# Patient Record
Sex: Female | Born: 1980 | Race: White | Hispanic: No | Marital: Married | Smoking: Former smoker
Health system: Southern US, Community
[De-identification: ages and names within clinical notes are randomized; demographics above are authoritative.]

## PROBLEM LIST (undated history)

## (undated) DIAGNOSIS — D219 Benign neoplasm of connective and other soft tissue, unspecified: Secondary | ICD-10-CM

## (undated) DIAGNOSIS — F419 Anxiety disorder, unspecified: Secondary | ICD-10-CM

## (undated) HISTORY — PX: TUBAL LIGATION: SHX77

## (undated) HISTORY — PX: WISDOM TOOTH EXTRACTION: SHX21

---

## 2018-07-24 ENCOUNTER — Other Ambulatory Visit: Payer: Self-pay | Admitting: Orthopedic Surgery

## 2018-07-30 NOTE — Pre-Procedure Instructions (Signed)
Megan Blanchard  07/30/2018      [id number] - [place], [address] AT [facility] [address] [place] [address] Phone: [phone] Fax: [phone]    Your procedure is scheduled on Wednesday August 14.  Report to [facility] Admitting at 10:00 A.M.  Call this number if you have problems the morning of surgery:  [phone]  If you have any questions prior to surgery, please call [phone] on M-F between 8:00AM and 4:30PM   Remember:  Do not eat or drink after midnight.    Take these medicines the morning of surgery with A SIP OF WATER: NONE  7 days prior to surgery STOP taking any Aspirin(unless otherwise instructed by your surgeon), Aleve, Naproxen, Ibuprofen, Motrin, Advil, Goody's, BC's, all herbal medications, fish oil, and all vitamins     Do not wear jewelry, make-up or nail polish.  Do not wear lotions, powders, or perfumes, or deodorant.  Do not shave 48 hours prior to surgery.  Men may shave face and neck.  Do not bring valuables to the hospital.  [facility] is not responsible for any belongings or valuables.  Contacts, dentures or bridgework may not be worn into surgery.  Leave your suitcase in the car.  After surgery it may be brought to your room.  For patients admitted to the hospital, discharge time will be determined by your treatment team.  Patients discharged the day of surgery will not be allowed to drive home.   Special instructions:    [facility]- Preparing For Surgery  Before surgery, you can play an important role. Because skin is not sterile, your skin needs to be as free of germs as possible. You can reduce the number of germs on your skin by washing with CHG (chlorahexidine gluconate) Soap before surgery.  CHG is an antiseptic cleaner which kills germs and bonds with the skin to continue killing germs even after washing.    Oral Hygiene is also important to reduce your risk  of infection.  Remember - BRUSH YOUR TEETH THE MORNING OF SURGERY WITH YOUR REGULAR TOOTHPASTE  Please do not use if you have an allergy to CHG or antibacterial soaps. If your skin becomes reddened/irritated stop using the CHG.  Do not shave (including legs and underarms) for at least 48 hours prior to first CHG shower. It is OK to shave your face.  Please follow these instructions carefully.   1. Shower the NIGHT BEFORE SURGERY and the MORNING OF SURGERY with CHG.   2. If you chose to wash your hair, wash your hair first as usual with your normal shampoo.  3. After you shampoo, rinse your hair and body thoroughly to remove the shampoo.  4. Use CHG as you would any other liquid soap. You can apply CHG directly to the skin and wash gently with a scrungie or a clean washcloth.   5. Apply the CHG Soap to your body ONLY FROM THE NECK DOWN.  Do not use on open wounds or open sores. Avoid contact with your eyes, ears, mouth and genitals (private parts). Wash Face and genitals (private parts)  with your normal soap.  6. Wash thoroughly, paying special attention to the area where your surgery will be performed.  7. Thoroughly rinse your body with warm water from the neck down.  8. DO NOT shower/wash with your normal soap after using and rinsing off  the CHG Soap.  9. Pat yourself dry with a CLEAN TOWEL.  10. Wear CLEAN PAJAMAS to bed the night before surgery, wear comfortable clothes the morning of surgery  11. Place CLEAN SHEETS on your bed the night of your first shower and DO NOT SLEEP WITH PETS.    Day of Surgery:  Do not apply any deodorants/lotions.  Please wear clean clothes to the hospital/surgery center.   Remember to brush your teeth WITH YOUR REGULAR TOOTHPASTE.    Please read over the following fact sheets that you were given. Coughing and Deep Breathing, MRSA Information and Surgical Site Infection Prevention

## 2018-07-31 ENCOUNTER — Encounter (HOSPITAL_COMMUNITY): Payer: Self-pay | Admitting: *Deleted

## 2018-07-31 ENCOUNTER — Encounter (HOSPITAL_COMMUNITY)
Admission: RE | Admit: 2018-07-31 | Discharge: 2018-07-31 | Disposition: A | Discharge status: Home / Self Care | Payer: No Typology Code available for payment source | Source: Ambulatory Visit | Attending: Orthopedic Surgery | Admitting: Orthopedic Surgery

## 2018-07-31 ENCOUNTER — Ambulatory Visit (HOSPITAL_COMMUNITY)
Admission: RE | Admit: 2018-07-31 | Discharge: 2018-07-31 | Disposition: A | Discharge status: Home / Self Care | Payer: No Typology Code available for payment source | Source: Ambulatory Visit | Attending: Orthopedic Surgery | Admitting: Orthopedic Surgery

## 2018-07-31 ENCOUNTER — Other Ambulatory Visit: Payer: Self-pay

## 2018-07-31 DIAGNOSIS — Z01818 Encounter for other preprocedural examination: Secondary | ICD-10-CM | POA: Diagnosis not present

## 2018-07-31 DIAGNOSIS — M533 Sacrococcygeal disorders, not elsewhere classified: Secondary | ICD-10-CM | POA: Diagnosis not present

## 2018-07-31 DIAGNOSIS — R9431 Abnormal electrocardiogram [ECG] [EKG]: Secondary | ICD-10-CM | POA: Diagnosis not present

## 2018-07-31 DIAGNOSIS — Z01810 Encounter for preprocedural cardiovascular examination: Secondary | ICD-10-CM | POA: Diagnosis present

## 2018-07-31 HISTORY — DX: Benign neoplasm of connective and other soft tissue, unspecified: D21.9

## 2018-07-31 HISTORY — DX: Anxiety disorder, unspecified: F41.9

## 2018-07-31 LAB — URINALYSIS, ROUTINE W REFLEX MICROSCOPIC
BILIRUBIN URINE: NEGATIVE
Bacteria, UA: NONE SEEN
GLUCOSE, UA: 50 mg/dL — AB
KETONES UR: NEGATIVE mg/dL
LEUKOCYTES UA: NEGATIVE
Nitrite: NEGATIVE
PH: 5 (ref 5.0–8.0)
Protein, ur: NEGATIVE mg/dL
Specific Gravity, Urine: 1.012 (ref 1.005–1.030)

## 2018-07-31 LAB — CBC WITH DIFFERENTIAL/PLATELET
ABS IMMATURE GRANULOCYTES: 0 10*3/uL (ref 0.0–0.1)
BASOS ABS: 0 10*3/uL (ref 0.0–0.1)
Basophils Relative: 0 %
Eosinophils Absolute: 0.1 10*3/uL (ref 0.0–0.7)
Eosinophils Relative: 2 %
HCT: 43.4 % (ref 36.0–46.0)
HEMOGLOBIN: 14.1 g/dL (ref 12.0–15.0)
Immature Granulocytes: 1 %
LYMPHS ABS: 1.5 10*3/uL (ref 0.7–4.0)
LYMPHS PCT: 28 %
MCH: 29.1 pg (ref 26.0–34.0)
MCHC: 32.5 g/dL (ref 30.0–36.0)
MCV: 89.5 fL (ref 78.0–100.0)
MONO ABS: 0.6 10*3/uL (ref 0.1–1.0)
Monocytes Relative: 12 %
NEUTROS ABS: 3.2 10*3/uL (ref 1.7–7.7)
Neutrophils Relative %: 57 %
Platelets: 242 10*3/uL (ref 150–400)
RBC: 4.85 MIL/uL (ref 3.87–5.11)
RDW: 12.8 % (ref 11.5–15.5)
WBC: 5.5 10*3/uL (ref 4.0–10.5)

## 2018-07-31 LAB — COMPREHENSIVE METABOLIC PANEL
ALBUMIN: 4.1 g/dL (ref 3.5–5.0)
ALT: 25 U/L (ref 0–44)
AST: 21 U/L (ref 15–41)
Alkaline Phosphatase: 49 U/L (ref 38–126)
Anion gap: 8 (ref 5–15)
BUN: 8 mg/dL (ref 6–20)
CALCIUM: 9.4 mg/dL (ref 8.9–10.3)
CO2: 25 mmol/L (ref 22–32)
CREATININE: 0.79 mg/dL (ref 0.44–1.00)
Chloride: 105 mmol/L (ref 98–111)
GFR calc Af Amer: >60 mL/min (ref >60)
GFR calc non Af Amer: >60 mL/min (ref >60)
GLUCOSE: 78 mg/dL (ref 70–99)
Potassium: 3.6 mmol/L (ref 3.5–5.1)
SODIUM: 138 mmol/L (ref 135–145)
Total Bilirubin: 0.8 mg/dL (ref 0.3–1.2)
Total Protein: 7 g/dL (ref 6.5–8.1)

## 2018-07-31 LAB — SURGICAL PCR SCREEN
MRSA, PCR: NEGATIVE
Staphylococcus aureus: NEGATIVE

## 2018-07-31 LAB — TYPE AND SCREEN
ABO/RH(D): A POS
Antibody Screen: NEGATIVE

## 2018-07-31 LAB — ABO/RH: ABO/RH(D): A POS

## 2018-07-31 LAB — PROTIME-INR
INR: 1.06
Prothrombin Time: 13.7 seconds (ref 11.4–15.2)

## 2018-07-31 LAB — APTT: aPTT: 32 seconds (ref 24–36)

## 2018-07-31 NOTE — Pre-Procedure Instructions (Signed)
Megan Blanchard  07/31/2018     Your procedure is scheduled on Wednesday August 14.  Report to [facility] Admitting at 9:00 A.M.  (per your surgeon's request)                Your surgery or procedure is scheduled for 12:00 noon   Call this number if you have problems the morning of surgery:   [phone]  tis is the pre- op desk.  For any other questions, please call [phone], Monday - Friday 8 AM - 4 PM.    If you have any questions prior to surgery, please call [phone] on M-F between 8:00AM and 4:30PM   Remember:  Do not eat or drink after midnight.    Take these medicines the morning of surgery with A SIP OF WATER: NONE  7 days prior to surgery STOP taking any Aspirin(unless otherwise instructed by your surgeon), Aleve, Naproxen, Ibuprofen, Motrin, Advil, Goody's, BC's, all herbal medications, fish oil, and all vitamins     Do not wear jewelry, make-up or nail polish.  Do not wear lotions, powders, or perfumes, or deodorant.  Do not shave 48 hours prior to surgery.  Men may shave face and neck.  Do not bring valuables to the hospital.  [facility] is not responsible for any belongings or valuables.  Contacts, dentures or bridgework may not be worn into surgery.  Leave your suitcase in the car.  After surgery it may be brought to your room.  For patients admitted to the hospital, discharge time will be determined by your treatment team.  Patients discharged the day of surgery will not be allowed to drive home.   Special instructions:    [place]- Preparing For Surgery  Before surgery, you can play an important role. Because skin is not sterile, your skin needs to be as free of germs as possible. You can reduce the number of germs on your skin by washing with CHG (chlorahexidine gluconate) Soap before surgery.  CHG is an antiseptic cleaner which kills germs and bonds with the skin to continue killing germs even after washing.    Oral Hygiene is also  important to reduce your risk of infection.  Remember - BRUSH YOUR TEETH THE MORNING OF SURGERY WITH YOUR REGULAR TOOTHPASTE  Please do not use if you have an allergy to CHG or antibacterial soaps. If your skin becomes reddened/irritated stop using the CHG.  Do not shave (including legs and underarms) for at least 48 hours prior to first CHG shower. It is OK to shave your face.  Please follow these instructions carefully.   1. Shower the NIGHT BEFORE SURGERY and the MORNING OF SURGERY with CHG.   2. If you chose to wash your hair, wash your hair first as usual with your normal shampoo.  3. After you shampoo, rinse your hair and body thoroughly to remove the shampoo.    Wash your face and private area with the soap you use at home, then rinse.  4. Use CHG as you would any other liquid soap. You can apply CHG directly to the skin and wash gently with a scrungie or a clean washcloth.   5. Apply the CHG Soap to your body ONLY FROM THE NECK DOWN.  Do not use on open wounds or open sores. Avoid contact with your eyes, ears, mouth and genitals (private parts).  6. Wash thoroughly, paying special attention to the area where your surgery will be performed.  7. Thoroughly rinse your body with warm water from the neck down.  8. DO NOT shower/wash with your normal soap after using and rinsing off the CHG Soap.  9. Pat yourself dry with a CLEAN TOWEL.  10. Wear CLEAN PAJAMAS to bed the night before surgery, wear comfortable clothes the morning of surgery  11. Place CLEAN SHEETS on your bed the night of your first shower and DO NOT SLEEP WITH PETS.  Day of Surgery: Shower as above  Do not apply any deodorants/lotions, powders or colognes..  Please wear clean clothes to the hospital/surgery center.   Remember to brush your teeth WITH YOUR REGULAR TOOTHPASTE.  Do not wear jewelry, make-up or nail polish.  Do not shave 48 hours prior to surgery.    Do not bring valuables to the hospital.  [facility] is not responsible for any belongings or valuables.  Contacts, dentures or bridgework may not be worn into surgery.  Leave your suitcase in the car.  After surgery it may be brought to your room.  For patients admitted to the hospital, discharge time will be determined by your treatment team.  Patients discharged the day of surgery will not be allowed to drive home.   Please read over the following fact sheets that you were given. Coughing and Deep Breathing, MRSA Information and Surgical Site Infection Prevention

## 2018-07-31 NOTE — Progress Notes (Signed)
Mrs Megan Blanchard denies chest pain or discomfort.  Patient does not have a PCP, she is employed by [employer] and she is seen at [employer] for health screens and if she has an issue, ie. respiratory infection.

## 2018-08-01 NOTE — Progress Notes (Signed)
Anesthesia Chart Review:   Case:  [id number] Date/Time:  08/08/18 1145   Procedure:  RIGHT - SIDED LUMBAR 5- SACRUM 1 MICRODISECTOMY (N/A )   Anesthesia type:  General   Pre-op diagnosis:  RIGHT BUTTOCK PAIN   Location:  [facility] OR ROOM 05 / [facility] [facility]   Surgeon:  Phylliss Bob, MD      DISCUSSION: - Pt is a 37 year old female with hx anxiety. Former smoker.    VS: BP 135/79   Pulse 85   Temp 36.8 C (Oral)   Resp 18   Ht 5\' 7"  (1.702 m)   Wt 230 lb 6 oz (104.5 kg)   LMP 06/27/2018   SpO2 99%   BMI 36.08 kg/m    PROVIDERS: No primary care provider on file.   LABS: Labs reviewed: Acceptable for surgery. (all labs ordered are listed, but only abnormal results are displayed)  Labs Reviewed  URINALYSIS, ROUTINE W REFLEX MICROSCOPIC - Abnormal; Notable for the following components:      Result Value   Glucose, UA 50 (*)    Hgb urine dipstick MODERATE (*)    All other components within normal limits  SURGICAL PCR SCREEN  APTT  CBC WITH DIFFERENTIAL/PLATELET  COMPREHENSIVE METABOLIC PANEL  PROTIME-INR  TYPE AND SCREEN  ABO/RH    IMAGES:  CXR 07/31/18: No edema or consolidation   EKG 07/31/18: NSR. Cannot rule out Anterior infarct, age undetermined versus lead placement. No prior EKG for comparison.     Past Medical History:  Diagnosis Date  . Anxiety   . Fibroids    uterine    Past Surgical History:  Procedure Laterality Date  . CESAREAN SECTION    . TUBAL LIGATION    . WISDOM TOOTH EXTRACTION      MEDICATIONS: . BC FAST PAIN RELIEF 650-195-33.3 MG PACK  . ibuprofen (ADVIL,MOTRIN) 200 MG tablet  . Multiple Vitamins-Minerals (ADULT GUMMY PO)   No current facility-administered medications for this encounter.     If no changes, I anticipate pt can proceed with surgery as scheduled.   Willeen Cass, FNP-BC [facility] Short Stay Surgical Center/Anesthesiology Phone: [phone] 08/01/2018 2:34 PM

## 2018-08-08 ENCOUNTER — Encounter (HOSPITAL_COMMUNITY): Payer: Self-pay

## 2018-08-08 ENCOUNTER — Ambulatory Visit (HOSPITAL_COMMUNITY): Payer: No Typology Code available for payment source | Admitting: Emergency Medicine

## 2018-08-08 ENCOUNTER — Ambulatory Visit (HOSPITAL_COMMUNITY)
Admission: RE | Disposition: A | Discharge status: Home / Self Care | Payer: Self-pay | Source: Ambulatory Visit | Attending: Orthopedic Surgery

## 2018-08-08 ENCOUNTER — Ambulatory Visit (HOSPITAL_COMMUNITY)
Admission: RE | Admit: 2018-08-08 | Discharge: 2018-08-08 | Disposition: A | Discharge status: Home / Self Care | Payer: No Typology Code available for payment source | Source: Ambulatory Visit | Attending: Orthopedic Surgery | Admitting: Orthopedic Surgery

## 2018-08-08 ENCOUNTER — Ambulatory Visit (HOSPITAL_COMMUNITY): Payer: No Typology Code available for payment source | Admitting: Anesthesiology

## 2018-08-08 ENCOUNTER — Ambulatory Visit (HOSPITAL_COMMUNITY): Payer: No Typology Code available for payment source

## 2018-08-08 DIAGNOSIS — F419 Anxiety disorder, unspecified: Secondary | ICD-10-CM | POA: Insufficient documentation

## 2018-08-08 DIAGNOSIS — Z883 Allergy status to other anti-infective agents status: Secondary | ICD-10-CM | POA: Insufficient documentation

## 2018-08-08 DIAGNOSIS — Z91041 Radiographic dye allergy status: Secondary | ICD-10-CM | POA: Insufficient documentation

## 2018-08-08 DIAGNOSIS — M5117 Intervertebral disc disorders with radiculopathy, lumbosacral region: Secondary | ICD-10-CM | POA: Diagnosis not present

## 2018-08-08 DIAGNOSIS — Z79899 Other long term (current) drug therapy: Secondary | ICD-10-CM | POA: Diagnosis not present

## 2018-08-08 DIAGNOSIS — Z888 Allergy status to other drugs, medicaments and biological substances status: Secondary | ICD-10-CM | POA: Insufficient documentation

## 2018-08-08 DIAGNOSIS — Z87891 Personal history of nicotine dependence: Secondary | ICD-10-CM | POA: Diagnosis not present

## 2018-08-08 DIAGNOSIS — Z419 Encounter for procedure for purposes other than remedying health state, unspecified: Secondary | ICD-10-CM

## 2018-08-08 HISTORY — PX: LUMBAR LAMINECTOMY/DECOMPRESSION MICRODISCECTOMY: SHX5026

## 2018-08-08 LAB — POCT PREGNANCY, URINE: PREG TEST UR: NEGATIVE

## 2018-08-08 SURGERY — LUMBAR LAMINECTOMY/DECOMPRESSION MICRODISCECTOMY
Anesthesia: General | Site: Back

## 2018-08-08 MED ORDER — CEFAZOLIN SODIUM-DEXTROSE 2-4 GM/100ML-% IV SOLN
2.0000 g | INTRAVENOUS | Status: AC
  Administered 2018-08-08: 2 g via INTRAVENOUS

## 2018-08-08 MED ORDER — BUPIVACAINE-EPINEPHRINE (PF) 0.25% -1:200000 IJ SOLN
INTRAMUSCULAR | Status: AC
  Filled 2018-08-08: qty 30

## 2018-08-08 MED ORDER — ARTIFICIAL TEARS OPHTHALMIC OINT
TOPICAL_OINTMENT | OPHTHALMIC | Status: DC | PRN
  Administered 2018-08-08: 1 via OPHTHALMIC

## 2018-08-08 MED ORDER — MIDAZOLAM HCL 2 MG/2ML IJ SOLN
INTRAMUSCULAR | Status: AC
  Filled 2018-08-08: qty 2

## 2018-08-08 MED ORDER — BUPIVACAINE LIPOSOME 1.3 % IJ SUSP
20.0000 mL | INTRAMUSCULAR | Status: AC
  Administered 2018-08-08: 20 mL
  Filled 2018-08-08: qty 20

## 2018-08-08 MED ORDER — 0.9 % SODIUM CHLORIDE (POUR BTL) OPTIME
TOPICAL | Status: DC | PRN
  Administered 2018-08-08: 1000 mL

## 2018-08-08 MED ORDER — PROPOFOL 10 MG/ML IV BOLUS
INTRAVENOUS | Status: DC | PRN
  Administered 2018-08-08: 200 mg via INTRAVENOUS

## 2018-08-08 MED ORDER — METHYLPREDNISOLONE ACETATE 40 MG/ML IJ SUSP
INTRAMUSCULAR | Status: DC | PRN
  Administered 2018-08-08: 40 mg

## 2018-08-08 MED ORDER — PROPOFOL 10 MG/ML IV BOLUS
INTRAVENOUS | Status: AC
  Filled 2018-08-08: qty 20

## 2018-08-08 MED ORDER — METOPROLOL TARTRATE 5 MG/5ML IV SOLN
INTRAVENOUS | Status: DC | PRN
  Administered 2018-08-08 (×2): 2.5 mg via INTRAVENOUS
  Administered 2018-08-08: 2 mg via INTRAVENOUS
  Administered 2018-08-08 (×3): 1 mg via INTRAVENOUS

## 2018-08-08 MED ORDER — GLYCOPYRROLATE 0.2 MG/ML IJ SOLN
INTRAMUSCULAR | Status: DC | PRN
  Administered 2018-08-08: 0.2 mg via INTRAVENOUS

## 2018-08-08 MED ORDER — CEFAZOLIN SODIUM-DEXTROSE 2-4 GM/100ML-% IV SOLN
INTRAVENOUS | Status: AC
  Filled 2018-08-08: qty 100

## 2018-08-08 MED ORDER — LIDOCAINE HCL (CARDIAC) PF 100 MG/5ML IV SOSY
PREFILLED_SYRINGE | INTRAVENOUS | Status: DC | PRN
  Administered 2018-08-08: 80 mg via INTRATRACHEAL

## 2018-08-08 MED ORDER — THROMBIN 20000 UNITS EX SOLR
CUTANEOUS | Status: DC | PRN
  Administered 2018-08-08: 12:00:00 via TOPICAL

## 2018-08-08 MED ORDER — PROPOFOL 1000 MG/100ML IV EMUL
INTRAVENOUS | Status: AC
  Filled 2018-08-08: qty 100

## 2018-08-08 MED ORDER — MINERAL OIL LIGHT 100 % EX OIL
TOPICAL_OIL | CUTANEOUS | Status: AC
  Filled 2018-08-08: qty 25

## 2018-08-08 MED ORDER — ONDANSETRON HCL 4 MG/2ML IJ SOLN: 4.0000 mg | Freq: Once | INTRAMUSCULAR | Status: DC | PRN

## 2018-08-08 MED ORDER — FENTANYL CITRATE (PF) 100 MCG/2ML IJ SOLN
25.0000 ug | INTRAMUSCULAR | Status: DC | PRN
  Administered 2018-08-08 (×2): 25 ug via INTRAVENOUS

## 2018-08-08 MED ORDER — METOPROLOL TARTRATE 5 MG/5ML IV SOLN
INTRAVENOUS | Status: AC
  Filled 2018-08-08: qty 5

## 2018-08-08 MED ORDER — MINERAL OIL LIGHT 100 % EX OIL
TOPICAL_OIL | CUTANEOUS | Status: DC | PRN
  Administered 2018-08-08: 1 via TOPICAL

## 2018-08-08 MED ORDER — LIDOCAINE 2% (20 MG/ML) 5 ML SYRINGE
INTRAMUSCULAR | Status: AC
  Filled 2018-08-08: qty 5

## 2018-08-08 MED ORDER — ROCURONIUM BROMIDE 10 MG/ML (PF) SYRINGE
PREFILLED_SYRINGE | INTRAVENOUS | Status: AC
  Filled 2018-08-08: qty 10

## 2018-08-08 MED ORDER — SUGAMMADEX SODIUM 200 MG/2ML IV SOLN
INTRAVENOUS | Status: DC | PRN
  Administered 2018-08-08: 200 mg via INTRAVENOUS

## 2018-08-08 MED ORDER — ROCURONIUM BROMIDE 100 MG/10ML IV SOLN
INTRAVENOUS | Status: DC | PRN
  Administered 2018-08-08: 60 mg via INTRAVENOUS
  Administered 2018-08-08: 20 mg via INTRAVENOUS
  Administered 2018-08-08: 10 mg via INTRAVENOUS

## 2018-08-08 MED ORDER — DEXAMETHASONE SODIUM PHOSPHATE 10 MG/ML IJ SOLN
INTRAMUSCULAR | Status: DC | PRN
  Administered 2018-08-08: 10 mg via INTRAVENOUS

## 2018-08-08 MED ORDER — SODIUM CHLORIDE 0.9 % IV SOLN
INTRAVENOUS | Status: DC | PRN
  Administered 2018-08-08: 25 ug/min via INTRAVENOUS

## 2018-08-08 MED ORDER — DEXAMETHASONE SODIUM PHOSPHATE 10 MG/ML IJ SOLN
INTRAMUSCULAR | Status: AC
  Filled 2018-08-08: qty 1

## 2018-08-08 MED ORDER — METHYLENE BLUE 0.5 % INJ SOLN
INTRAVENOUS | Status: DC | PRN
  Administered 2018-08-08: .5 mL

## 2018-08-08 MED ORDER — METHYLENE BLUE 0.5 % INJ SOLN
INTRAVENOUS | Status: AC
  Filled 2018-08-08: qty 10

## 2018-08-08 MED ORDER — METHYLPREDNISOLONE ACETATE 40 MG/ML IJ SUSP
INTRAMUSCULAR | Status: AC
  Filled 2018-08-08: qty 1

## 2018-08-08 MED ORDER — LACTATED RINGERS IV SOLN
INTRAVENOUS | Status: DC | PRN
  Administered 2018-08-08 (×2): via INTRAVENOUS

## 2018-08-08 MED ORDER — FENTANYL CITRATE (PF) 100 MCG/2ML IJ SOLN
INTRAMUSCULAR | Status: AC
  Administered 2018-08-08: 25 ug via INTRAVENOUS
  Filled 2018-08-08: qty 2

## 2018-08-08 MED ORDER — PROPOFOL 500 MG/50ML IV EMUL
INTRAVENOUS | Status: DC | PRN
  Administered 2018-08-08: 25 ug/kg/min via INTRAVENOUS

## 2018-08-08 MED ORDER — ARTIFICIAL TEARS OPHTHALMIC OINT
TOPICAL_OINTMENT | OPHTHALMIC | Status: AC
  Filled 2018-08-08: qty 3.5

## 2018-08-08 MED ORDER — GLYCOPYRROLATE PF 0.2 MG/ML IJ SOSY
PREFILLED_SYRINGE | INTRAMUSCULAR | Status: AC
  Filled 2018-08-08: qty 1

## 2018-08-08 MED ORDER — FENTANYL CITRATE (PF) 250 MCG/5ML IJ SOLN
INTRAMUSCULAR | Status: DC | PRN
  Administered 2018-08-08: 25 ug via INTRAVENOUS
  Administered 2018-08-08: 100 ug via INTRAVENOUS
  Administered 2018-08-08: 25 ug via INTRAVENOUS
  Administered 2018-08-08: 100 ug via INTRAVENOUS

## 2018-08-08 MED ORDER — THROMBIN (RECOMBINANT) 20000 UNITS EX SOLR
CUTANEOUS | Status: AC
  Filled 2018-08-08: qty 20000

## 2018-08-08 MED ORDER — ALBUMIN HUMAN 5 % IV SOLN
INTRAVENOUS | Status: DC | PRN
  Administered 2018-08-08 (×2): via INTRAVENOUS

## 2018-08-08 MED ORDER — FENTANYL CITRATE (PF) 250 MCG/5ML IJ SOLN
INTRAMUSCULAR | Status: AC
  Filled 2018-08-08: qty 5

## 2018-08-08 MED ORDER — BUPIVACAINE-EPINEPHRINE 0.25% -1:200000 IJ SOLN
INTRAMUSCULAR | Status: DC | PRN
  Administered 2018-08-08: 25 mL
  Administered 2018-08-08: 5 mL

## 2018-08-08 MED ORDER — ONDANSETRON HCL 4 MG/2ML IJ SOLN
INTRAMUSCULAR | Status: AC
  Filled 2018-08-08: qty 2

## 2018-08-08 MED ORDER — ONDANSETRON HCL 4 MG/2ML IJ SOLN
INTRAMUSCULAR | Status: DC | PRN
  Administered 2018-08-08: 4 mg via INTRAVENOUS

## 2018-08-08 MED ORDER — MENTHOL 3 MG MT LOZG
LOZENGE | OROMUCOSAL | Status: AC
  Filled 2018-08-08: qty 9

## 2018-08-08 MED ORDER — POVIDONE-IODINE 7.5 % EX SOLN
Freq: Once | CUTANEOUS | Status: DC
  Filled 2018-08-08: qty 118

## 2018-08-08 MED ORDER — MIDAZOLAM HCL 2 MG/2ML IJ SOLN
INTRAMUSCULAR | Status: DC | PRN
  Administered 2018-08-08 (×2): 1 mg via INTRAVENOUS

## 2018-08-08 SURGICAL SUPPLY — 69 items
BENZOIN TINCTURE PRP APPL 2/3 (GAUZE/BANDAGES/DRESSINGS) IMPLANT
BUR PRECISION FLUTE 5.0 (BURR) ×3 IMPLANT
CABLE BIPOLOR RESECTION CORD (MISCELLANEOUS) ×3 IMPLANT
CANISTER SUCT 3000ML PPV (MISCELLANEOUS) ×3 IMPLANT
CARTRIDGE OIL MAESTRO DRILL (MISCELLANEOUS) ×1 IMPLANT
CLOSURE WOUND 1/2 X4 (GAUZE/BANDAGES/DRESSINGS)
COVER SURGICAL LIGHT HANDLE (MISCELLANEOUS) ×3 IMPLANT
DIFFUSER DRILL AIR PNEUMATIC (MISCELLANEOUS) ×3 IMPLANT
DRAIN CHANNEL 15F RND FF W/TCR (WOUND CARE) IMPLANT
DRAPE POUCH INSTRU U-SHP 10X18 (DRAPES) ×6 IMPLANT
DRAPE SURG 17X23 STRL (DRAPES) ×12 IMPLANT
DURAPREP 26ML APPLICATOR (WOUND CARE) ×3 IMPLANT
ELECT BLADE 4.0 EZ CLEAN MEGAD (MISCELLANEOUS) ×3
ELECT CAUTERY BLADE 6.4 (BLADE) ×3 IMPLANT
ELECT REM PT RETURN 9FT ADLT (ELECTROSURGICAL) ×3
ELECTRODE BLDE 4.0 EZ CLN MEGD (MISCELLANEOUS) ×1 IMPLANT
ELECTRODE REM PT RTRN 9FT ADLT (ELECTROSURGICAL) ×1 IMPLANT
EVACUATOR SILICONE 100CC (DRAIN) IMPLANT
FILTER STRAW FLUID ASPIR (MISCELLANEOUS) ×3 IMPLANT
GAUZE 4X4 16PLY RFD (DISPOSABLE) ×3 IMPLANT
GAUZE SPONGE 4X4 12PLY STRL (GAUZE/BANDAGES/DRESSINGS) ×3 IMPLANT
GLOVE BIO SURGEON STRL SZ7 (GLOVE) ×3 IMPLANT
GLOVE BIO SURGEON STRL SZ8 (GLOVE) ×3 IMPLANT
GLOVE BIOGEL PI IND STRL 7.0 (GLOVE) ×1 IMPLANT
GLOVE BIOGEL PI IND STRL 8 (GLOVE) ×1 IMPLANT
GLOVE BIOGEL PI INDICATOR 7.0 (GLOVE) ×2
GLOVE BIOGEL PI INDICATOR 8 (GLOVE) ×2
GOWN STRL REUS W/ TWL LRG LVL3 (GOWN DISPOSABLE) ×1 IMPLANT
GOWN STRL REUS W/ TWL XL LVL3 (GOWN DISPOSABLE) ×2 IMPLANT
GOWN STRL REUS W/TWL LRG LVL3 (GOWN DISPOSABLE) ×2
GOWN STRL REUS W/TWL XL LVL3 (GOWN DISPOSABLE) ×4
IV CATH 14GX2 1/4 (CATHETERS) ×3 IMPLANT
KIT BASIN OR (CUSTOM PROCEDURE TRAY) ×3 IMPLANT
KIT POSITION SURG JACKSON T1 (MISCELLANEOUS) ×3 IMPLANT
KIT TURNOVER KIT B (KITS) ×3 IMPLANT
NEEDLE 18GX1X1/2 (RX/OR ONLY) (NEEDLE) ×3 IMPLANT
NEEDLE 22X1 1/2 (OR ONLY) (NEEDLE) ×3 IMPLANT
NEEDLE HYPO 25GX1X1/2 BEV (NEEDLE) ×3 IMPLANT
NEEDLE SPNL 18GX3.5 QUINCKE PK (NEEDLE) ×6 IMPLANT
NS IRRIG 1000ML POUR BTL (IV SOLUTION) ×3 IMPLANT
OIL CARTRIDGE MAESTRO DRILL (MISCELLANEOUS) ×3
PACK LAMINECTOMY ORTHO (CUSTOM PROCEDURE TRAY) ×3 IMPLANT
PACK UNIVERSAL I (CUSTOM PROCEDURE TRAY) ×3 IMPLANT
PAD ARMBOARD 7.5X6 YLW CONV (MISCELLANEOUS) ×6 IMPLANT
PATTIES SURGICAL .5 X.5 (GAUZE/BANDAGES/DRESSINGS) IMPLANT
PATTIES SURGICAL .5 X1 (DISPOSABLE) ×6 IMPLANT
SPONGE INTESTINAL PEANUT (DISPOSABLE) ×3 IMPLANT
SPONGE SURGIFOAM ABS GEL 100 (HEMOSTASIS) ×3 IMPLANT
SPONGE SURGIFOAM ABS GEL SZ50 (HEMOSTASIS) ×3 IMPLANT
STRIP CLOSURE SKIN 1/2X4 (GAUZE/BANDAGES/DRESSINGS) IMPLANT
SURGIFLO W/THROMBIN 8M KIT (HEMOSTASIS) ×3 IMPLANT
SUT MNCRL AB 4-0 PS2 18 (SUTURE) ×3 IMPLANT
SUT VIC AB 0 CT1 18XCR BRD 8 (SUTURE) IMPLANT
SUT VIC AB 0 CT1 27 (SUTURE)
SUT VIC AB 0 CT1 27XBRD ANBCTR (SUTURE) IMPLANT
SUT VIC AB 0 CT1 8-18 (SUTURE)
SUT VIC AB 1 CT1 18XCR BRD 8 (SUTURE) ×1 IMPLANT
SUT VIC AB 1 CT1 8-18 (SUTURE) ×2
SUT VIC AB 2-0 CT2 18 VCP726D (SUTURE) ×3 IMPLANT
SYR 20CC LL (SYRINGE) ×3 IMPLANT
SYR BULB IRRIGATION 50ML (SYRINGE) ×3 IMPLANT
SYR CONTROL 10ML LL (SYRINGE) ×6 IMPLANT
SYR TB 1ML 26GX3/8 SAFETY (SYRINGE) ×6 IMPLANT
SYR TB 1ML LUER SLIP (SYRINGE) ×6 IMPLANT
TAPE CLOTH SURG 4X10 WHT LF (GAUZE/BANDAGES/DRESSINGS) ×3 IMPLANT
TOWEL GREEN STERILE (TOWEL DISPOSABLE) ×3 IMPLANT
TOWEL GREEN STERILE FF (TOWEL DISPOSABLE) ×3 IMPLANT
WATER STERILE IRR 1000ML POUR (IV SOLUTION) ×3 IMPLANT
YANKAUER SUCT BULB TIP NO VENT (SUCTIONS) ×3 IMPLANT

## 2018-08-08 NOTE — Anesthesia Procedure Notes (Signed)
Procedure Name: Intubation Date/Time: 08/08/2018 11:28 AM Performed by: Suzette Battiest, MD Pre-anesthesia Checklist: Patient identified, Emergency Drugs available, Suction available and Patient being monitored Patient Re-evaluated:Patient Re-evaluated prior to induction Oxygen Delivery Method: Circle system utilized Preoxygenation: Pre-oxygenation with 100% oxygen Induction Type: IV induction Ventilation: Mask ventilation without difficulty Laryngoscope Size: Mac and 3 Grade View: Grade II Tube type: Oral Tube size: 7.0 mm Number of attempts: 2 (DLx2 with first one placed in esophagus, removed with DL with grade 2 view.  + ETCO2 noted, equal chest rise, vss) Airway Equipment and Method: Stylet Placement Confirmation: ETT inserted through vocal cords under direct vision,  positive ETCO2 and breath sounds checked- equal and bilateral Secured at: 23 cm Tube secured with: Tape Dental Injury: Bloody posterior oropharynx

## 2018-08-08 NOTE — Anesthesia Preprocedure Evaluation (Signed)
Anesthesia Evaluation  Patient identified by MRN, date of birth, ID band Patient awake    Reviewed: Allergy & Precautions, NPO status , Patient's Chart, lab work & pertinent test results  Airway Mallampati: II  TM Distance: >3 FB Neck ROM: Full    Dental  (+) Dental Advisory Given   Pulmonary former smoker,    breath sounds clear to auscultation       Cardiovascular negative cardio ROS   Rhythm:Regular Rate:Normal     Neuro/Psych negative neurological ROS     GI/Hepatic negative GI ROS, Neg liver ROS,   Endo/Other  negative endocrine ROS  Renal/GU negative Renal ROS     Musculoskeletal   Abdominal   Peds  Hematology negative hematology ROS (+)   Anesthesia Other Findings   Reproductive/Obstetrics                             Lab Results  Component Value Date   WBC 5.5 07/31/2018   HGB 14.1 07/31/2018   HCT 43.4 07/31/2018   MCV 89.5 07/31/2018   PLT 242 07/31/2018   Lab Results  Component Value Date   CREATININE 0.79 07/31/2018   BUN 8 07/31/2018   NA 138 07/31/2018   K 3.6 07/31/2018   CL 105 07/31/2018   CO2 25 07/31/2018    Anesthesia Physical Anesthesia Plan  ASA: I  Anesthesia Plan: General   Post-op Pain Management:    Induction: Intravenous  PONV Risk Score and Plan:   Airway Management Planned: Oral ETT  Additional Equipment:   Intra-op Plan:   Post-operative Plan: Extubation in OR  Informed Consent: I have reviewed the patients History and Physical, chart, labs and discussed the procedure including the risks, benefits and alternatives for the proposed anesthesia with the patient or authorized representative who has indicated his/her understanding and acceptance.     Plan Discussed with:   Anesthesia Plan Comments:         Anesthesia Quick Evaluation

## 2018-08-08 NOTE — H&P (Signed)
PREOPERATIVE H&P  Chief Complaint: Right leg pain  HPI: Megan Blanchard is a 37 y.o. female who presents with ongoing pain in the right leg  MRI reveals a large right L5/S1 HNP  Patient has failed multiple forms of conservative care and continues to have pain (see office notes for additional details regarding the patient's full course of treatment)  Past Medical History:  Diagnosis Date  . Anxiety   . Fibroids    uterine   Past Surgical History:  Procedure Laterality Date  . CESAREAN SECTION    . TUBAL LIGATION    . WISDOM TOOTH EXTRACTION     Social History   Socioeconomic History  . Marital status: Married    Spouse name: Not on file  . Number of children: Not on file  . Years of education: Not on file  . Highest education level: Not on file  Occupational History  . Not on file  Social Needs  . Financial resource strain: Not on file  . Food insecurity:    Worry: Not on file    Inability: Not on file  . Transportation needs:    Medical: Not on file    Non-medical: Not on file  Tobacco Use  . Smoking status: Former Smoker    Years: 6.00    Last attempt to quit: 2006    Years since quitting: 13.6  . Smokeless tobacco: Never Used  . Tobacco comment: was a light smoker  Substance and Sexual Activity  . Alcohol use: Never    Frequency: Never  . Drug use: Never  . Sexual activity: Not on file  Lifestyle  . Physical activity:    Days per week: Not on file    Minutes per session: Not on file  . Stress: Not on file  Relationships  . Social connections:    Talks on phone: Not on file    Gets together: Not on file    Attends religious service: Not on file    Active member of club or organization: Not on file    Attends meetings of clubs or organizations: Not on file    Relationship status: Not on file  Other Topics Concern  . Not on file  Social History Narrative  . Not on file   History reviewed. No pertinent family history. Allergies  Allergen  Reactions  . Iodinated Diagnostic Agents Anaphylaxis, Nausea Only and Other (See Comments)    Headaches.  . Sumatriptan Shortness Of Breath and Other (See Comments)    CHEST PAIN    . Azithromycin Other (See Comments)    Dizziness.  . Prednisone Other (See Comments)    dizziness   Prior to Admission medications   Medication Sig Start Date End Date Taking? Authorizing Provider  BC FAST PAIN RELIEF 650-195-33.3 MG PACK Take 1 packet by mouth daily as needed (for pain.).   Yes [provider]  ibuprofen (ADVIL,MOTRIN) 200 MG tablet Take 200 mg by mouth every 8 (eight) hours as needed (for pain.).   Yes [provider]  Multiple Vitamins-Minerals (ADULT GUMMY PO) Take 1 tablet by mouth daily.   Yes [provider]     All other systems have been reviewed and were otherwise negative with the exception of those mentioned in the HPI and as above.  Physical Exam: Vitals:   08/08/18 0751  BP: 139/80  Pulse: 75  Resp: 18  Temp: 98.7 F (37.1 C)  SpO2: 98%    Body mass index  is 36.02 kg/m.  General: Alert, no acute distress Cardiovascular: No pedal edema Respiratory: No cyanosis, no use of accessory musculature Skin: No lesions in the area of chief complaint Neurologic: Sensation intact distally Psychiatric: Patient is competent for consent with normal mood and affect Lymphatic: No axillary or cervical lymphadenopathy  MUSCULOSKELETAL: + SLR on the right  Assessment/Plan: RIGHT SIDED LUMBAR RADICULOPATHY Plan for Procedure(s): RIGHT - SIDED LUMBAR 5- SACRUM 1 MICRODISECTOMY   Sinclair Ship, MD 08/08/2018 8:08 AM

## 2018-08-08 NOTE — Op Note (Signed)
NAME:  Megan Blanchard          MEDICAL RECORD NO.:  [id number]  PHYSICIAN:  Phylliss Bob, MD      DATE OF BIRTH:  1981/06/22  DATE OF PROCEDURE:  08/14/219                              OPERATIVE REPORT   PREOPERATIVE DIAGNOSES: 1. Right-sided S1 radiculopathy. 2. 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  POSTOPERATIVE DIAGNOSES: 1. Right-sided S1 radiculopathy. 2. 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  PROCEDURES:  Complex right-sided L5-S1 laminotomy with partial facetectomy and removal of large chronic herniated right-sided L5-S1 disk fragment.  SURGEON:  Phylliss Bob, MD.  ASSISTANTPricilla Holm, PA-C.  ANESTHESIA:  General endotracheal anesthesia.  COMPLICATIONS:  None.  DISPOSITION:  Stable.  ESTIMATED BLOOD LOSS:  Minimal.  INDICATIONS FOR SURGERY:  Briefly, Megan Blanchard is a pleasant 37 year old female, who did present to me with severe pain in the right leg.  The patient's MRI did reveal the findings outlined above, clearly notable for a large herniated disk fragment. The pain was rather severe.  We did discuss treatment options and we did ultimately elect to proceed with the procedure reflected above.  The patient was fully made aware of the risks of surgery, including the risk of recurrent herniation and the need for subsequent surgery, including the possibility of a subsequent diskectomy and/or fusion.  OPERATIVE DETAILS:  On 08/08/2018, the patient was brought to surgery and general endotracheal anesthesia was administered.  The patient was placed prone on a well-padded flat Jackson bed with a spinal frame.  Antibiotics were given.  The back was prepped and draped and a time-out procedure was performed.  At this point, a midline incision was made directly over the L5-S1 intervertebral space.  A curvilinear incision was made just to  the right of the midline into the fascia.  A self-retaining McCulloch retractor was placed.  The lamina of L5 and S1 was identified and subperiosteally exposed.  I then removed the lateral aspect of the L5-S1 ligamentum flavum.  Readily identified was the traversing right S1 nerve, which was noted to be under obvious tension, and was noted to be rather erythematous and inflamed.  I did make an initial attempt at retracting the nerve medially, however, it was very clear that the nerve was very adherent to the large disc herniation immediately ventral to it.  I did spend a significant amount of time releasing adhesions between the traversing right S1 nerve, and the herniated disc fragment.  This was a very meticulous and time-consuming portion of the procedure, taking approximately 45 minutes in total.  I was ultimately able to safely gain medial retraction of the nerve, and in doing so, a very large herniated disk fragment was readily noted.  This was removed in multiple fragments.  Of note, the herniated disc material was adherent to the annulus, and portions of the posterior aspect of the annulus  did need to be removed with the multiple, very chronic appearing, herniated fragments.  I did liberally use an Epstein curette, in order to displace multiple disc fragments into the intervertebral space, after which point they were removed.  This was also very meticulous and time-consuming portion of the procedure, taking a total of approximately 45 minutes to 1 hour.  Multiple disc fragments did need to be removed in order to thoroughly decompress the traversing right S1 nerve.  I was very pleased with the final decompression that I was able to accomplish.  At this point, the wound was copiously irrigated with normal saline.  All epidural bleeding was controlled using bipolar electrocautery in addition to Surgiflo. All bleeding was controlled at the termination of the procedure.  At this point, 20 mg of  Depo-Medrol was introduced about the epidural space in the region of the right S1 nerve.  The wound was then closed in layers using #1 Vicryl followed by 0 Vicryl, followed by 4-0 Monocryl. Benzoin and Steri-Strips were applied followed by a sterile dressing. All instrument counts were correct at the termination of the procedure.  Of note, Pricilla Holm was my assistant throughout surgery, and did aid in retraction, suctioning, and closure from start to finish.     Phylliss Bob, MD

## 2018-08-08 NOTE — Progress Notes (Signed)
56mcg of Fentanyl wasted with Corrie Dandy

## 2018-08-08 NOTE — Transfer of Care (Signed)
Immediate Anesthesia Transfer of Care Note  Patient: Megan Blanchard  Procedure(s) Performed: RIGHT - SIDED LUMBAR 5- SACRUM 1 MICRODISECTOMY (N/A Back)  Patient Location: PACU  Anesthesia Type:General  Level of Consciousness: awake, alert  and patient cooperative  Airway & Oxygen Therapy: Patient Spontanous Breathing and Patient connected to face mask oxygen  Post-op Assessment: Report given to RN, Post -op Vital signs reviewed and stable, Patient moving all extremities X 4 and Patient able to stick tongue midline  Post vital signs: Reviewed and stable  Last Vitals:  Vitals Value Taken Time  BP 119/66 08/08/2018  3:12 PM  Temp    Pulse 87 08/08/2018  3:12 PM  Resp 14 08/08/2018  3:12 PM  SpO2 95 % 08/08/2018  3:12 PM  Vitals shown include unvalidated device data.  Last Pain:  Vitals:   08/08/18 0802  TempSrc:   PainSc: 2       Patients Stated Pain Goal: 1 (47/42/59 [id number])  Complications: No apparent anesthesia complications

## 2018-08-09 ENCOUNTER — Encounter (HOSPITAL_COMMUNITY): Payer: Self-pay | Admitting: Orthopedic Surgery

## 2018-08-09 MED FILL — Thrombin (Recombinant) For Soln 20000 Unit: CUTANEOUS | Qty: 1 | Status: AC

## 2018-08-09 NOTE — Anesthesia Postprocedure Evaluation (Signed)
Anesthesia Post Note  Patient: Megan Blanchard  Procedure(s) Performed: RIGHT - SIDED LUMBAR 5- SACRUM 1 MICRODISECTOMY (N/A Back)     Patient location during evaluation: PACU Anesthesia Type: General Level of consciousness: awake and alert Pain management: pain level controlled Vital Signs Assessment: post-procedure vital signs reviewed and stable Respiratory status: spontaneous breathing, nonlabored ventilation, respiratory function stable and patient connected to nasal cannula oxygen Cardiovascular status: blood pressure returned to baseline and stable Postop Assessment: no apparent nausea or vomiting Anesthetic complications: no    Last Vitals:  Vitals:   08/08/18 1545 08/08/18 1605  BP: 124/70 114/75  Pulse: 82 71  Resp: 17 15  Temp:    SpO2: 95% 98%    Last Pain:  Vitals:   08/08/18 1605  TempSrc:   PainSc: 7                  Tiajuana Amass

## 2019-12-29 IMAGING — CR DG CHEST 2V
2 series · 2 of 2 positions shown · non-contrast
Comparison: None.

CLINICAL DATA: Preoperative evaluation for lumbar surgery.

EXAM:
CHEST - 2 VIEW

[w chest pa]
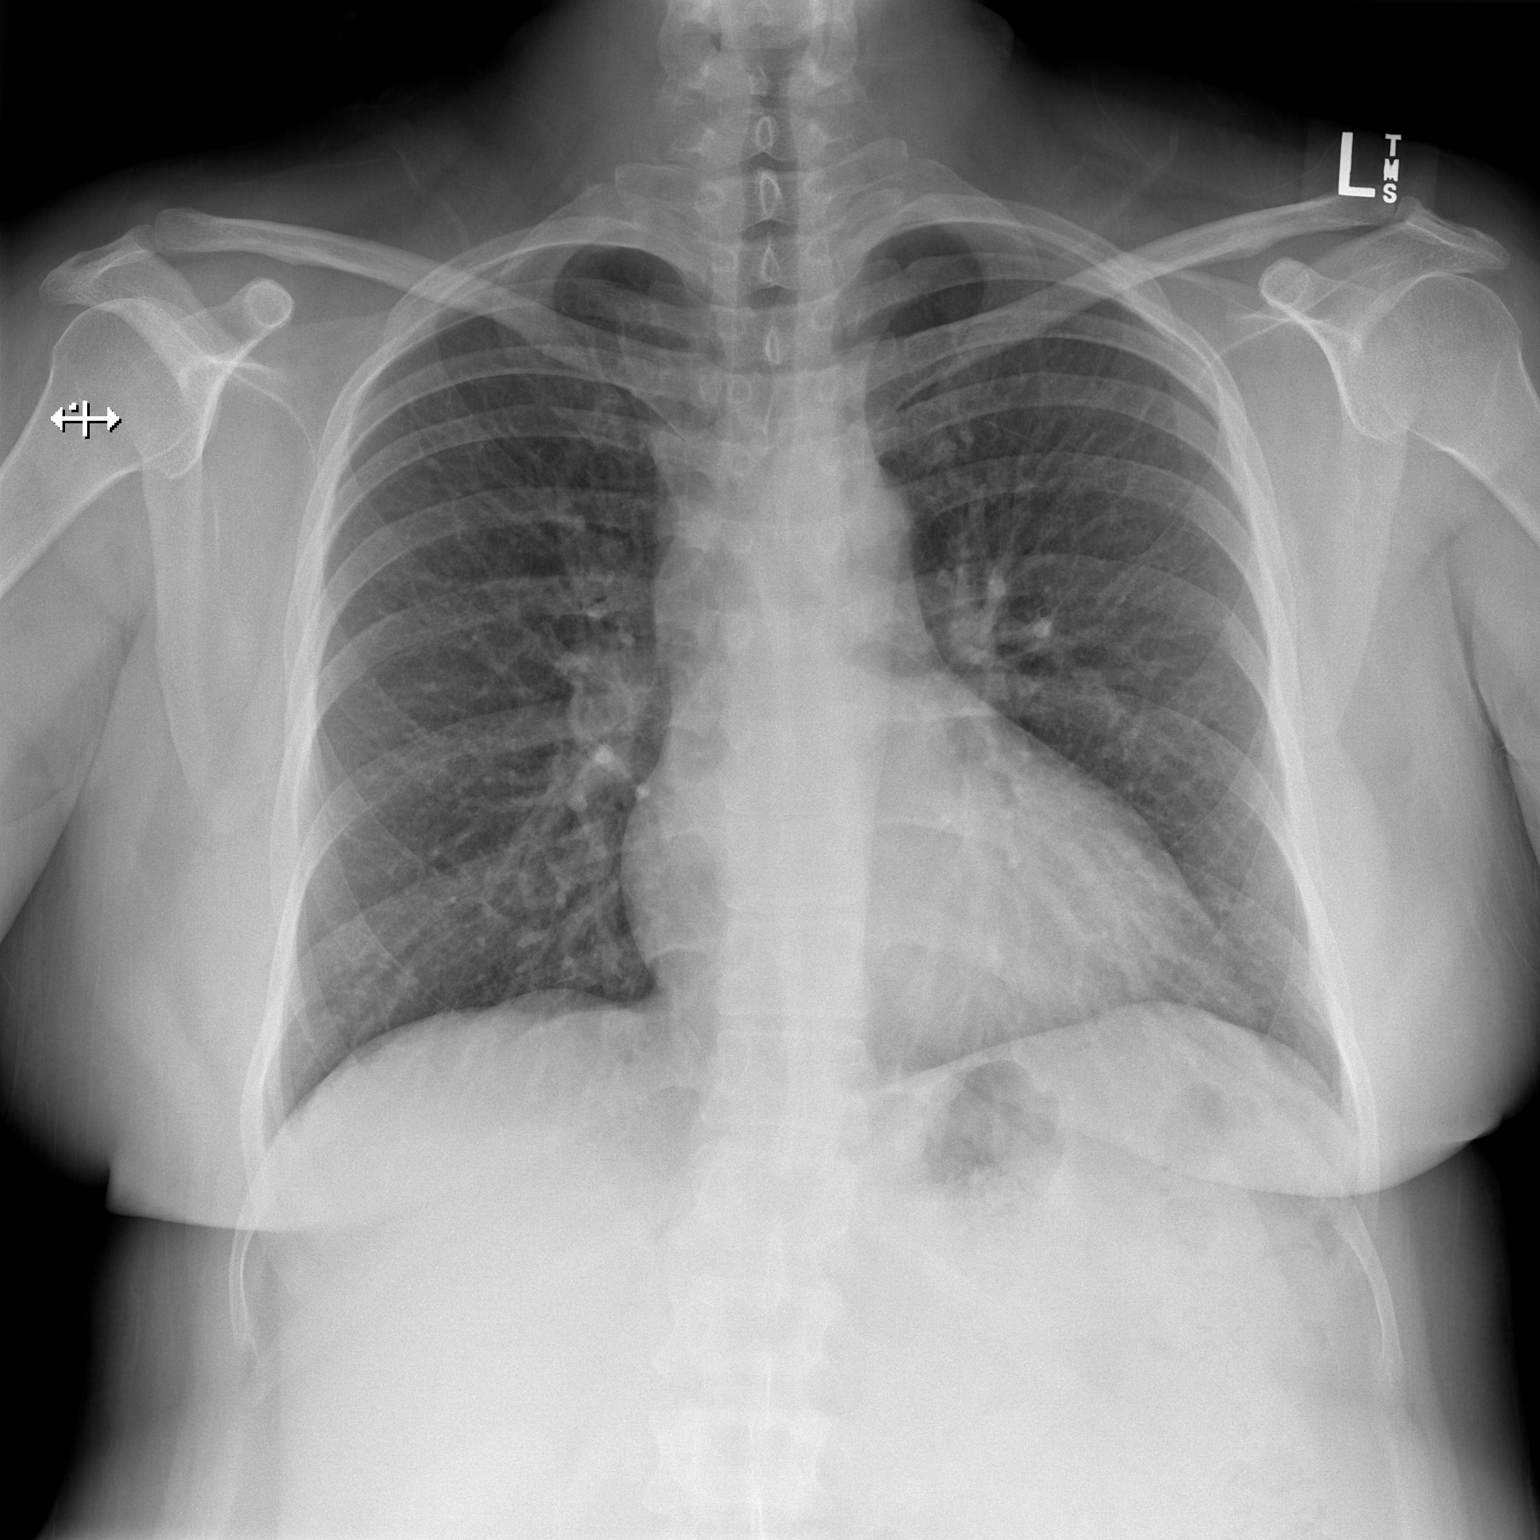

[w chest lat]
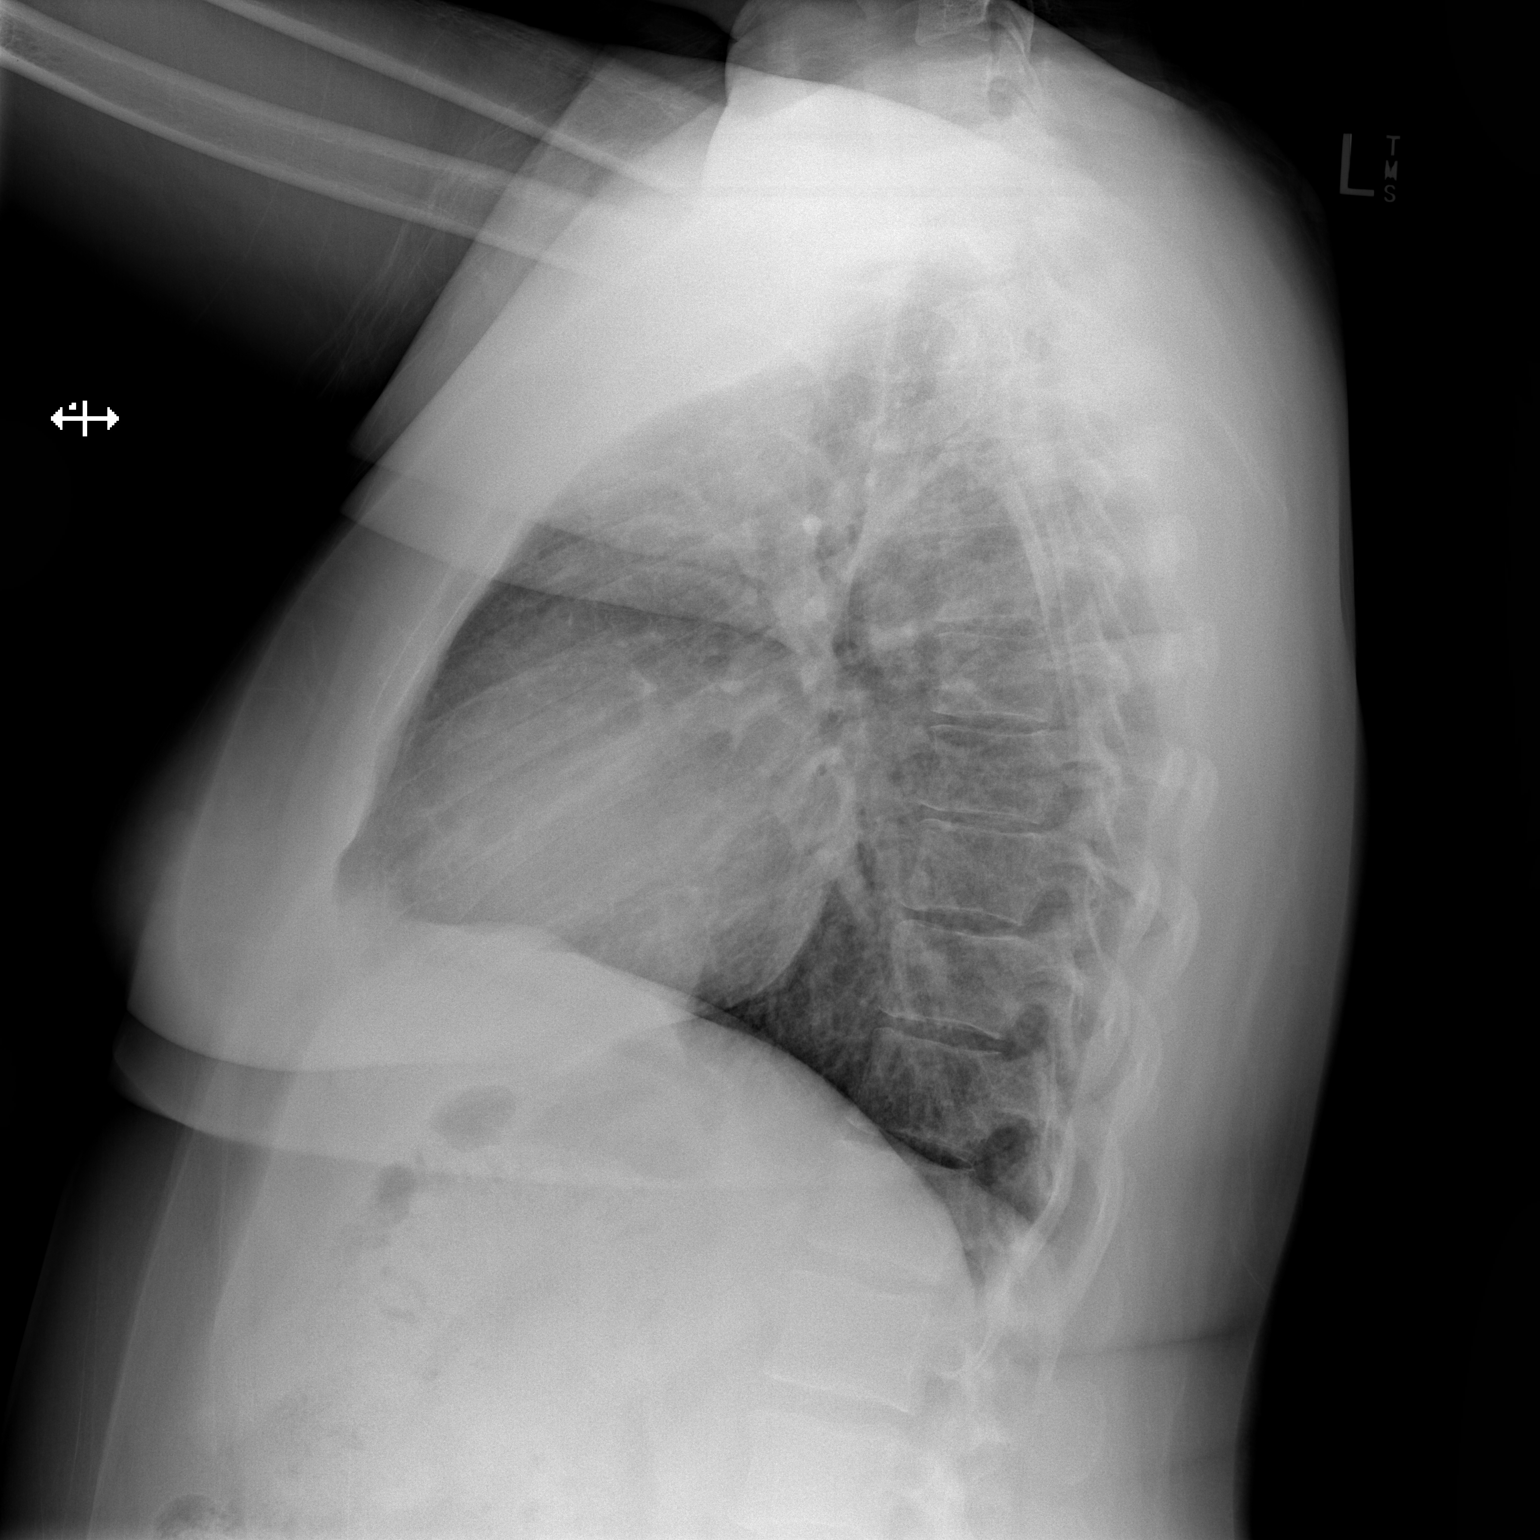

[2 of 2 positions shown; findings below may reference images not displayed]

FINDINGS: There is no edema or consolidation. Heart size and pulmonary
vascularity are normal. No adenopathy. No evident bone lesions.
IMPRESSION: No edema or consolidation.

## 2020-01-06 IMAGING — CR DG LUMBAR SPINE 2-3V
2 series · 2 of 2 positions shown · non-contrast
Comparison: None.

CLINICAL DATA: Right-sided micro discectomy

EXAM:
LUMBAR SPINE - 2-3 VIEW

[xtable lateral (1 of 2)]
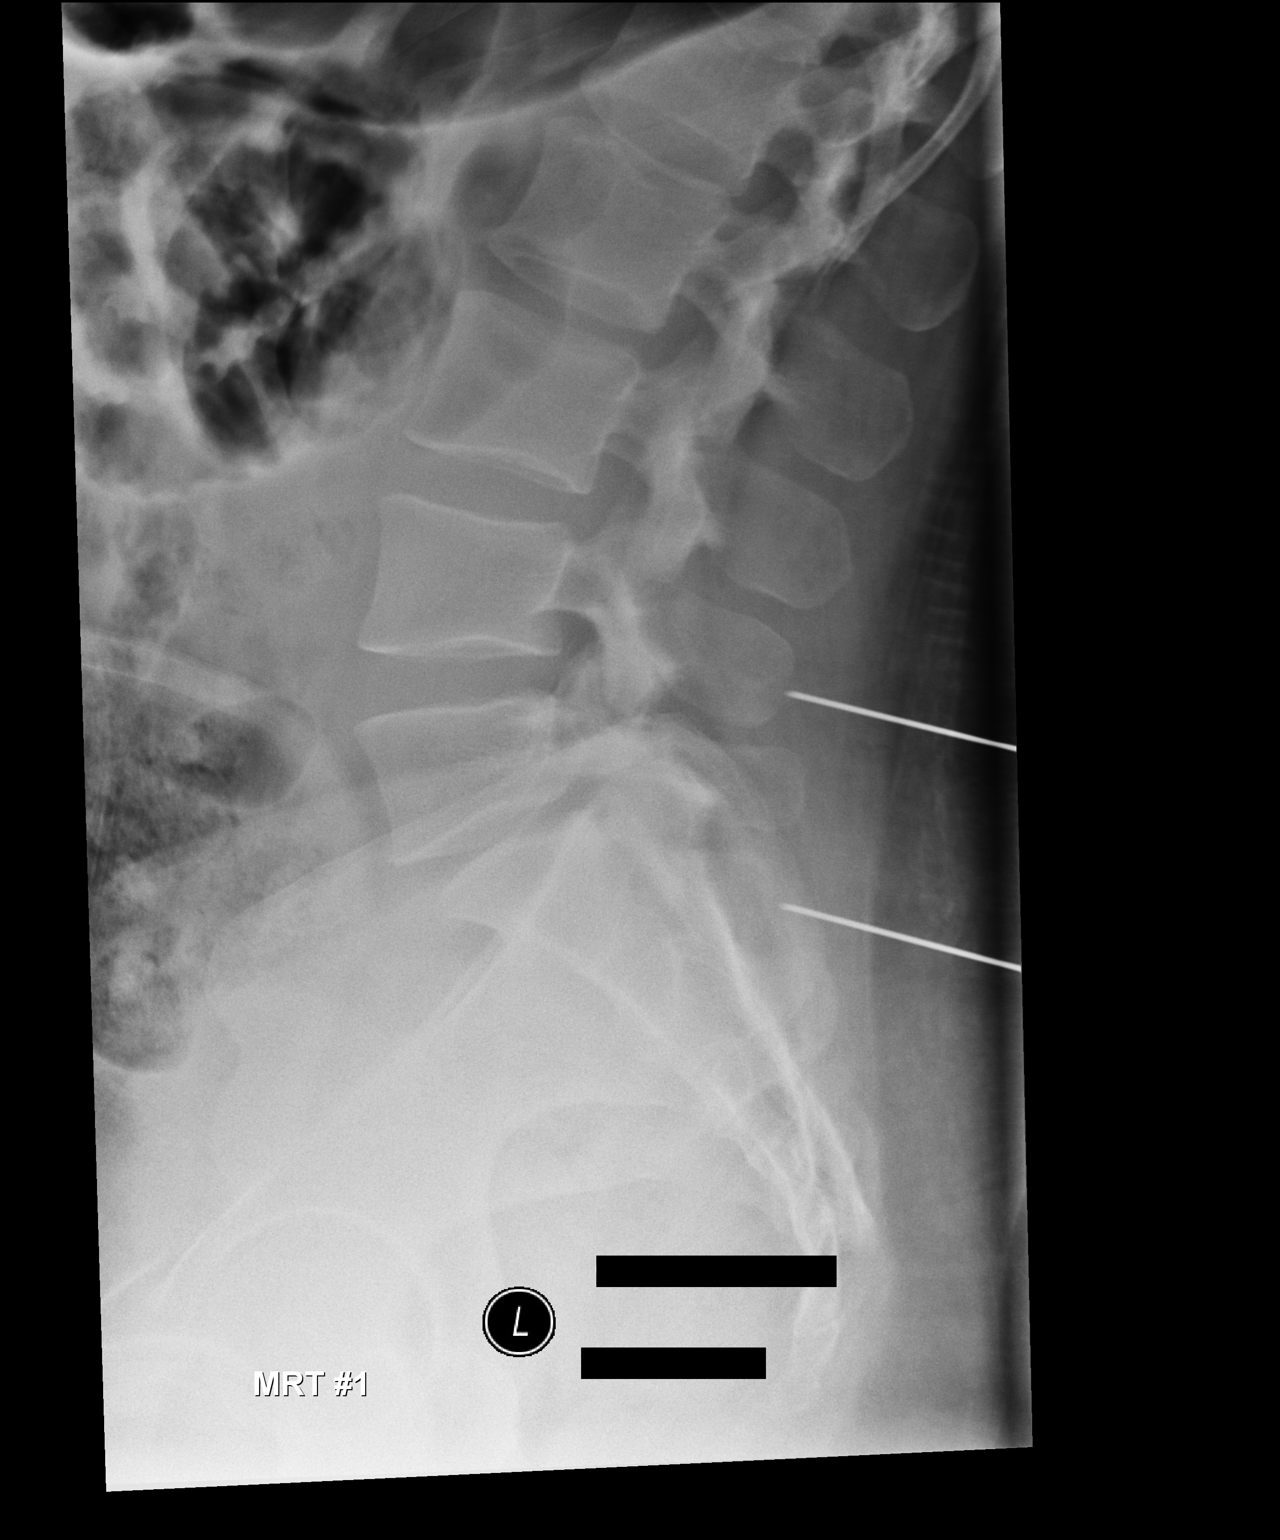

[xtable lateral (2 of 2)]
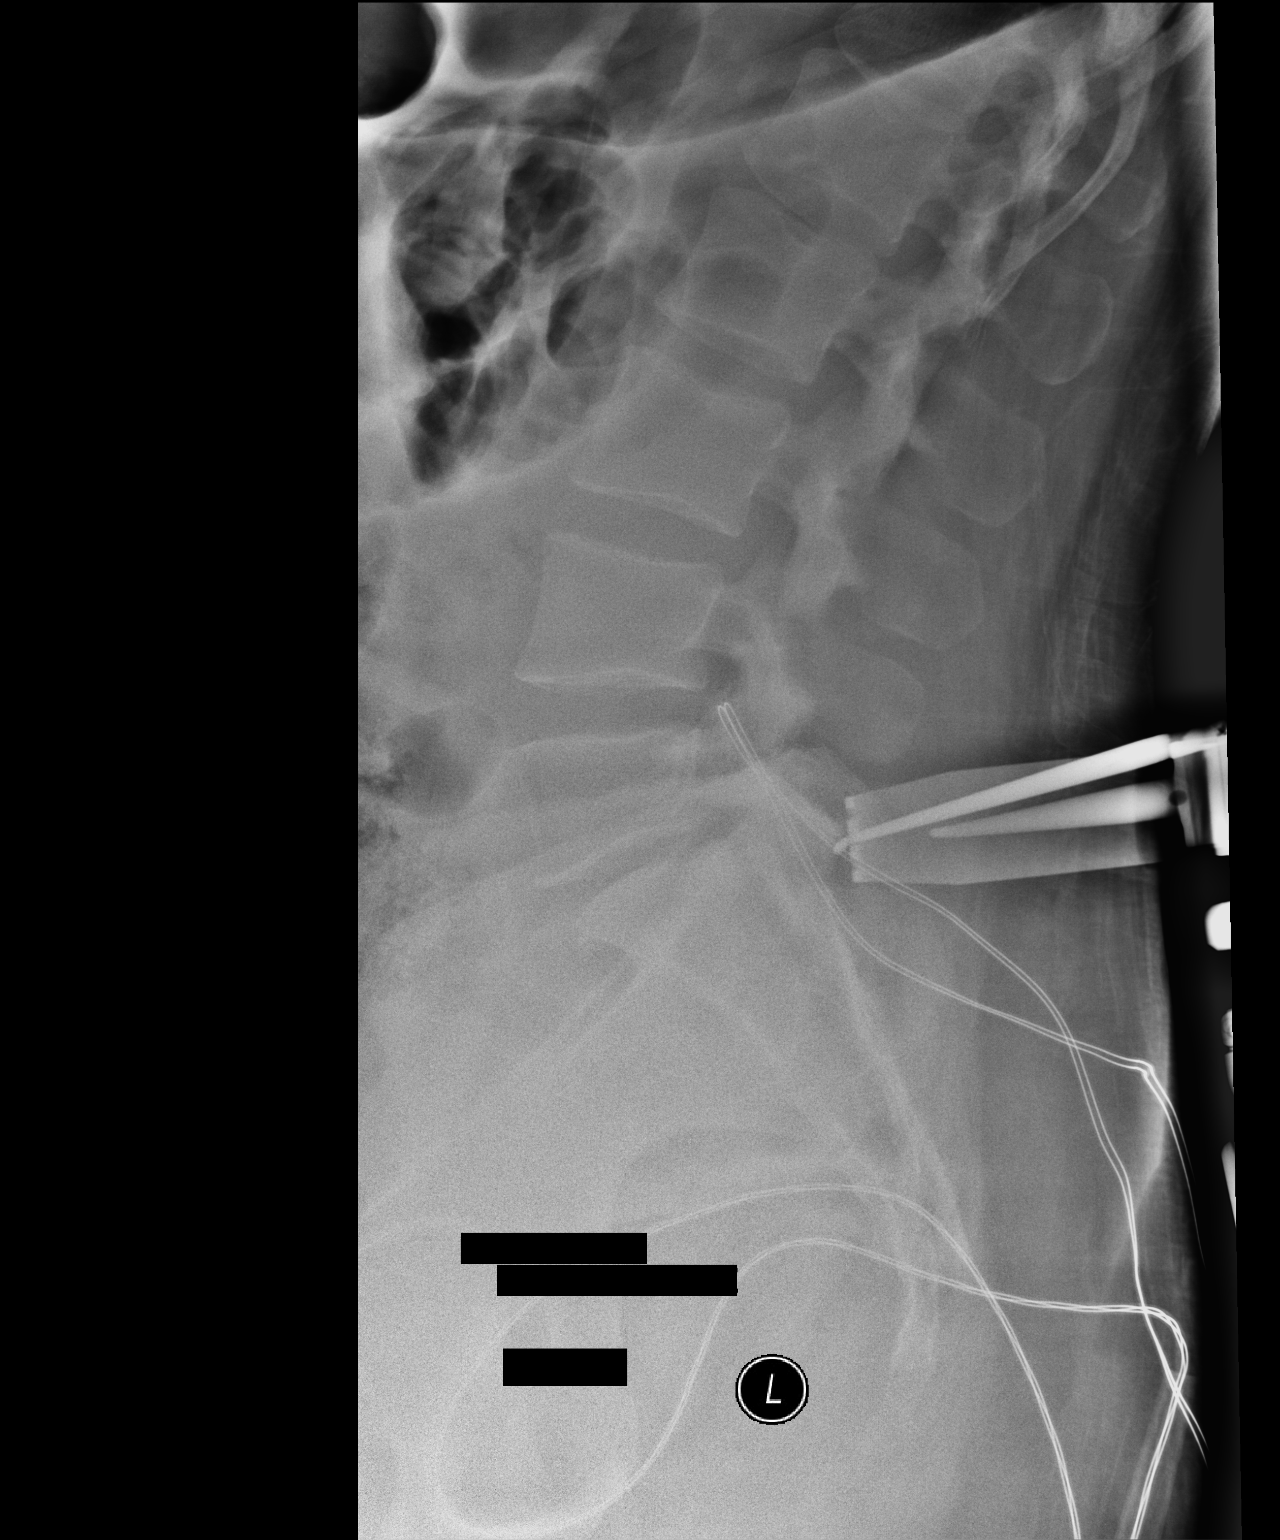

[2 of 2 positions shown; findings below may reference images not displayed]

FINDINGS: Two lateral images of the lumbar spine. Initial image at 0000
demonstrates linear localizing devices; the cranial device is
posterior to the spinous process of L4, the cephalad device is
posterior to the upper sacrum. Subsequent lateral image at [DATE]
p.m. demonstrates surgical instruments posterior to L5-S1.
IMPRESSION: Limited lateral views for localization purposes during lumbar spine
surgery.

## 2022-02-25 ENCOUNTER — Other Ambulatory Visit: Payer: Self-pay | Admitting: Cardiovascular Disease

## 2022-02-25 ENCOUNTER — Other Ambulatory Visit (HOSPITAL_COMMUNITY): Payer: Self-pay | Admitting: Cardiovascular Disease

## 2022-02-25 DIAGNOSIS — R079 Chest pain, unspecified: Secondary | ICD-10-CM

## 2022-03-24 ENCOUNTER — Encounter (HOSPITAL_COMMUNITY): Payer: Self-pay
